# Patient Record
Sex: Male | Born: 2000
Health system: Southern US, Community
[De-identification: ages and names within clinical notes are randomized; demographics above are authoritative.]

## PROBLEM LIST (undated history)

## (undated) DIAGNOSIS — T7840XA Allergy, unspecified, initial encounter: Secondary | ICD-10-CM

## (undated) DIAGNOSIS — K219 Gastro-esophageal reflux disease without esophagitis: Secondary | ICD-10-CM

## (undated) DIAGNOSIS — H52209 Unspecified astigmatism, unspecified eye: Secondary | ICD-10-CM

## (undated) HISTORY — DX: Allergy, unspecified, initial encounter: T78.40XA

## (undated) HISTORY — DX: Unspecified astigmatism, unspecified eye: H52.209

## (undated) HISTORY — DX: Gastro-esophageal reflux disease without esophagitis: K21.9

---
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
## 2019-03-10 DIAGNOSIS — Z113 Encounter for screening for infections with a predominantly sexual mode of transmission: Secondary | ICD-10-CM | POA: Diagnosis not present

## 2019-03-10 DIAGNOSIS — Z1331 Encounter for screening for depression: Secondary | ICD-10-CM | POA: Diagnosis not present

## 2019-03-10 DIAGNOSIS — Z0001 Encounter for general adult medical examination with abnormal findings: Secondary | ICD-10-CM | POA: Diagnosis not present

## 2019-03-10 DIAGNOSIS — Z713 Dietary counseling and surveillance: Secondary | ICD-10-CM | POA: Diagnosis not present

## 2019-03-10 DIAGNOSIS — Z68.41 Body mass index (BMI) pediatric, less than 5th percentile for age: Secondary | ICD-10-CM | POA: Diagnosis not present

## 2019-04-28 DIAGNOSIS — M25774 Osteophyte, right foot: Secondary | ICD-10-CM | POA: Diagnosis not present

## 2019-04-28 DIAGNOSIS — M25571 Pain in right ankle and joints of right foot: Secondary | ICD-10-CM | POA: Diagnosis not present

## 2019-05-15 DIAGNOSIS — Z23 Encounter for immunization: Secondary | ICD-10-CM | POA: Diagnosis not present

## 2019-10-19 DIAGNOSIS — Z113 Encounter for screening for infections with a predominantly sexual mode of transmission: Secondary | ICD-10-CM | POA: Diagnosis not present

## 2019-10-19 DIAGNOSIS — M791 Myalgia, unspecified site: Secondary | ICD-10-CM | POA: Diagnosis not present

## 2019-10-19 DIAGNOSIS — Z0000 Encounter for general adult medical examination without abnormal findings: Secondary | ICD-10-CM | POA: Diagnosis not present

## 2019-10-19 DIAGNOSIS — R1013 Epigastric pain: Secondary | ICD-10-CM | POA: Diagnosis not present

## 2019-10-19 DIAGNOSIS — Z13220 Encounter for screening for lipoid disorders: Secondary | ICD-10-CM | POA: Diagnosis not present

## 2020-01-01 DIAGNOSIS — M25562 Pain in left knee: Secondary | ICD-10-CM | POA: Diagnosis not present

## 2020-01-01 DIAGNOSIS — S8992XA Unspecified injury of left lower leg, initial encounter: Secondary | ICD-10-CM | POA: Diagnosis not present

## 2020-01-01 DIAGNOSIS — R52 Pain, unspecified: Secondary | ICD-10-CM | POA: Diagnosis not present

## 2020-01-01 DIAGNOSIS — M2392 Unspecified internal derangement of left knee: Secondary | ICD-10-CM | POA: Diagnosis not present

## 2020-01-01 DIAGNOSIS — I10 Essential (primary) hypertension: Secondary | ICD-10-CM | POA: Diagnosis not present

## 2020-01-01 DIAGNOSIS — J45909 Unspecified asthma, uncomplicated: Secondary | ICD-10-CM | POA: Diagnosis not present

## 2020-01-01 DIAGNOSIS — M238X2 Other internal derangements of left knee: Secondary | ICD-10-CM | POA: Diagnosis not present

## 2020-01-04 DIAGNOSIS — S8002XA Contusion of left knee, initial encounter: Secondary | ICD-10-CM | POA: Diagnosis not present

## 2023-05-18 ENCOUNTER — Other Ambulatory Visit: Payer: Self-pay | Admitting: Internal Medicine

## 2023-05-18 ENCOUNTER — Ambulatory Visit
Admission: RE | Admit: 2023-05-18 | Discharge: 2023-05-18 | Disposition: A | Discharge status: Home / Self Care | Payer: BC Managed Care – PPO | Source: Ambulatory Visit | Attending: Internal Medicine | Admitting: Internal Medicine

## 2023-05-18 DIAGNOSIS — M79604 Pain in right leg: Secondary | ICD-10-CM

## 2024-08-18 ENCOUNTER — Other Ambulatory Visit: Payer: Self-pay | Admitting: Internal Medicine

## 2024-08-18 DIAGNOSIS — R1013 Epigastric pain: Secondary | ICD-10-CM

## 2024-09-01 ENCOUNTER — Ambulatory Visit
Admission: RE | Admit: 2024-09-01 | Discharge: 2024-09-01 | Disposition: A | Discharge status: Home / Self Care | Source: Ambulatory Visit | Attending: Internal Medicine | Admitting: Internal Medicine

## 2024-09-01 DIAGNOSIS — R1013 Epigastric pain: Secondary | ICD-10-CM
